# Patient Record
Sex: Male | Born: 1995 | Race: Black or African American | Hispanic: No | Marital: Single | Smoking: Never smoker
Health system: Southern US, Community
[De-identification: ages and names within clinical notes are randomized; demographics above are authoritative.]

---

## 2020-01-10 ENCOUNTER — Emergency Department (HOSPITAL_COMMUNITY)
Admission: EM | Admit: 2020-01-10 | Discharge: 2020-01-10 | Discharge status: Against Medical Advice | Payer: BC Managed Care – PPO | Attending: Emergency Medicine | Admitting: Emergency Medicine

## 2020-01-10 ENCOUNTER — Other Ambulatory Visit: Payer: Self-pay

## 2020-01-10 ENCOUNTER — Encounter (HOSPITAL_COMMUNITY): Payer: Self-pay | Admitting: Obstetrics and Gynecology

## 2020-01-10 DIAGNOSIS — Z113 Encounter for screening for infections with a predominantly sexual mode of transmission: Secondary | ICD-10-CM | POA: Insufficient documentation

## 2020-01-10 DIAGNOSIS — Z202 Contact with and (suspected) exposure to infections with a predominantly sexual mode of transmission: Secondary | ICD-10-CM | POA: Diagnosis present

## 2020-01-10 DIAGNOSIS — J069 Acute upper respiratory infection, unspecified: Secondary | ICD-10-CM | POA: Diagnosis not present

## 2020-01-10 LAB — URINALYSIS, ROUTINE W REFLEX MICROSCOPIC
Bacteria, UA: NONE SEEN
Bilirubin Urine: NEGATIVE
Glucose, UA: NEGATIVE mg/dL
Ketones, ur: NEGATIVE mg/dL
Leukocytes,Ua: NEGATIVE
Nitrite: NEGATIVE
Protein, ur: NEGATIVE mg/dL
Specific Gravity, Urine: 1.006 (ref 1.005–1.030)
pH: 7 (ref 5.0–8.0)

## 2020-01-10 NOTE — ED Triage Notes (Signed)
Patient reports he thinks he was exposed to an STI. Patient reports unprotected sex x2 days ago.

## 2020-01-10 NOTE — ED Notes (Signed)
Patient not in room

## 2020-01-10 NOTE — ED Provider Notes (Signed)
[facility]-EMERGENCY DEPT Provider Note   CSN: [id number] Arrival date & time: 01/10/20  9509     History Chief Complaint  Patient presents with  . Exposure to STD    Kipling Graser is a 24 y.o. male with no pertinent past medical history that presents to the emergency department today for  STD cehck and secondary complaint of headache and sore throat for the past 2 days.  Patient states that 2 days ago he had sexual encounter, unprotected.  States that he is unsure if his male partner has an STD, he does not think that she does but he wants to be safe and be tested for all STDs at this time.  Only admits to vaginal intercourse, does not admit to anal or oral intercourse.  Is not concerned about any STDs in his mouth.  States that he is having some penile itching, denies any penile discharge, scrotal swelling, scrotal pain, penile pain, penile bumps or lumps, penile lesions, rashes, fevers.  Patient states that also 2 days ago he has noticed that he has had a sore throat and headache.  States that he has not been around any sick contacts.  States that his throat is only sore when he swallows, not at all times.  Has not had his Covid vaccines.  Denies any cough, ear pain, eye pain, allergies.  Has not take anything for this.  Does not have migraine history.  States that headache is mild all over his head.  Denies any chest pain, shortness of breath, abdominal pain, nausea, vomiting, anosmia, congestion, diarrhea, diarrhea.  States that he was in normal health before this.  No urinary complaints.  HPI     History reviewed. No pertinent past medical history.  There are no problems to display for this patient.   History reviewed. No pertinent surgical history.     History reviewed. No pertinent family history.  Social History   Tobacco Use  . Smoking status: Never Smoker  Substance Use Topics  . Alcohol use: Not Currently  . Drug use: Not Currently    Home  Medications Prior to Admission medications   Not on File    Allergies    Patient has no allergy information on record.  Review of Systems   Review of Systems  Constitutional: Negative for chills, diaphoresis, fatigue, fever and unexpected weight change.  HENT: Positive for sore throat. Negative for congestion, dental problem, drooling, ear discharge, facial swelling, hearing loss, mouth sores, nosebleeds, sinus pressure, sinus pain, tinnitus, trouble swallowing and voice change.   Eyes: Negative for visual disturbance.  Respiratory: Negative for shortness of breath.   Cardiovascular: Negative for chest pain.  Gastrointestinal: Negative for nausea and vomiting.  Genitourinary: Negative for decreased urine volume, difficulty urinating, discharge, dysuria, enuresis, flank pain, frequency, genital sores, hematuria, penile pain, penile swelling, scrotal swelling, testicular pain and urgency.  Musculoskeletal: Negative for back pain and myalgias.  Skin: Negative for color change, pallor, rash and wound.  Neurological: Positive for headaches. Negative for syncope, weakness, light-headedness and numbness.  Psychiatric/Behavioral: Negative for behavioral problems and confusion.    Physical Exam Updated Vital Signs BP 124/82 (BP Location: Left Arm)   Pulse 67   Temp 98.7 F (37.1 C) (Oral)   Resp 18   SpO2 100%   Physical Exam Constitutional:      General: He is not in acute distress.    Appearance: Normal appearance. He is not ill-appearing, toxic-appearing or diaphoretic.  Comments: Patient without acute respiratory stress.  Patient is sitting comfortably in bed, no tripoding, use of accessory muscles.  Patient is speaking to me in full sentences.  Handling secretions well.  HENT:     Head: Normocephalic and atraumatic.     Jaw: There is normal jaw occlusion. No trismus, swelling or malocclusion.     Nose: No congestion or rhinorrhea.     Right Sinus: No maxillary sinus tenderness  or frontal sinus tenderness.     Left Sinus: No maxillary sinus tenderness or frontal sinus tenderness.     Mouth/Throat:     Mouth: Mucous membranes are moist. No oral lesions.     Dentition: Normal dentition.     Tongue: No lesions.     Palate: No mass and lesions.     Pharynx: Oropharynx is clear. Uvula midline. No pharyngeal swelling, oropharyngeal exudate, posterior oropharyngeal erythema or uvula swelling.     Tonsils: No tonsillar exudate or tonsillar abscesses. 1+ on the right. 1+ on the left.     Comments: Patient without tonsillar enlargement or exudate.  No signs of peritonsillar abscess, palate without any tenderness or masses palpated.  No swelling under the tongue, uvula is midline without any inflammation. Eyes:     General: No visual field deficit.       Right eye: No discharge.        Left eye: No discharge.     Extraocular Movements: Extraocular movements intact.     Conjunctiva/sclera: Conjunctivae normal.     Pupils: Pupils are equal, round, and reactive to light.  Cardiovascular:     Rate and Rhythm: Normal rate and regular rhythm.     Pulses: Normal pulses.     Heart sounds: Normal heart sounds. No murmur heard.  No friction rub. No gallop.   Pulmonary:     Effort: Pulmonary effort is normal. No respiratory distress.     Breath sounds: Normal breath sounds. No stridor. No wheezing, rhonchi or rales.  Chest:     Chest wall: No tenderness.  Abdominal:     General: Abdomen is flat. Bowel sounds are normal. There is no distension.     Palpations: Abdomen is soft.     Tenderness: There is no abdominal tenderness. There is no right CVA tenderness or left CVA tenderness.  Genitourinary:    Comments: Patient rejected. Musculoskeletal:        General: No swelling or tenderness. Normal range of motion.     Cervical back: Normal range of motion. No rigidity or tenderness.     Right lower leg: No edema.     Left lower leg: No edema.  Lymphadenopathy:     Cervical: No  cervical adenopathy.  Skin:    General: Skin is warm and dry.     Capillary Refill: Capillary refill takes less than 2 seconds.     Findings: No erythema or rash.  Neurological:     General: No focal deficit present.     Mental Status: He is alert and oriented to person, place, and time.     Cranial Nerves: Cranial nerves are intact. No cranial nerve deficit or facial asymmetry.     Motor: Motor function is intact. No weakness.     Coordination: Coordination is intact.     Gait: Gait is intact. Gait normal.  Psychiatric:        Mood and Affect: Mood normal.        Behavior: Behavior normal.  Thought Content: Thought content normal.     ED Results / Procedures / Treatments   Labs (all labs ordered are listed, but only abnormal results are displayed) Labs Reviewed  URINALYSIS, ROUTINE W REFLEX MICROSCOPIC - Abnormal; Notable for the following components:      Result Value   Color, Urine STRAW (*)    Hgb urine dipstick SMALL (*)    All other components within normal limits  GROUP A STREP BY PCR  SARS CORONAVIRUS 2 BY RT PCR (HOSPITAL ORDER, PERFORMED IN [facility])  MONONUCLEOSIS SCREEN  RPR  HIV ANTIBODY (ROUTINE TESTING W REFLEX)  GC/CHLAMYDIA PROBE AMP ([place]) NOT AT [facility]    EKG None  Radiology No results found.  Procedures Procedures (including critical care time)  Medications Ordered in ED Medications - No data to display  ED Course  I have reviewed the triage vital signs and the nursing notes.  Pertinent labs & imaging results that were available during my care of the patient were reviewed by me and considered in my medical decision making (see chart for details).    MDM Rules/Calculators/A&P                         Browning Southwood is a 24 y.o. male with no pertinent past medical history that presents to the emergency department today for exposure to STD and secondary complaint of headache and sore throat for the past 2 days.  Patient  was actually exposed to STD that he is aware of, states that he just wants to be checked because he started having some penile itching.  Patient deferred GU exam at this time, is not complaining any scrotal swelling, penile pain, penile lesions, penile masses.  Patient denies any oral intercourse, states that he also has sore throat with headache.  Will get strep testing, mono testing and Covid testing at this time, patient has not had his Covid vaccines.  Physical exam without any concerns for peritonsillar abscess, deep space infection, Ludwig's.  No tonsillar enlargement or tonsillar exudate noticed on exam.  Patient does not want oral STD testing.  Strep testing and mono testing negative.  Urinalysis acutely normal.  Discussed that patient will get results on MyChart.  Nursing advised me that patient eloped.  Patient eloped before testing was able to be done.  Only GC chlamydia urine was collected.  Did not get to discuss risks or treatment options with patient since patient eloped.  Doubt need for further emergent work up at this time. I explained the diagnosis and have given explicit precautions to return to the ER including for any other new or worsening symptoms. The patient understands and accepts the medical plan as it's been dictated and I have answered their questions. Discharge instructions concerning home care and prescriptions have been given. The patient is STABLE and is discharged to home in good condition.  Final Clinical Impression(s) / ED Diagnoses Final diagnoses:  Screen for STD (sexually transmitted disease)  Viral upper respiratory tract infection    Rx / DC Orders ED Discharge Orders    None       Farrel Gordon, PA-C 01/10/20 1556    Milagros Loll, MD 01/12/20 [id number]

## 2020-01-10 NOTE — Discharge Instructions (Signed)
You will get your results on MyChart.  If your Covid test is positive, you will get a call.  If your STD tests are positive then you will also get a call.  Use the attached instructions.  Your COVID test is pending; you should expect results in 2-3 days. You can access your results on your MyChart--if you test positive you should receive a phone call.  In the meantime follow CDC guidelines and quarantine, wear a mask, wash hands often.    Please return to ED if you feel have difficulty breathing or have emergent, new or concerning symptoms.  Patients who have symptoms consistent with COVID-19 should self isolated for: At least 3 days (72 hours) have passed since recovery, defined as resolution of fever without the use of fever reducing medications and improvement in respiratory symptoms (e.g., cough, shortness of breath), and At least 7 days have passed since symptoms first appeared.       Person Under Monitoring Name: Roy Dougherty  Location: [address] [place] [address]   Infection Prevention Recommendations for Individuals Confirmed to have, or Being Evaluated for, 2019 Novel Coronavirus (COVID-19) Infection Who Receive Care at Home  Individuals who are confirmed to have, or are being evaluated for, COVID-19 should follow the prevention steps below until a healthcare provider or local or state health department says they can return to normal activities.  Stay home except to get medical care You should restrict activities outside your home, except for getting medical care. Do not go to work, school, or public areas, and do not use public transportation or taxis.  Call ahead before visiting your doctor Before your medical appointment, call the healthcare provider and tell them that you have, or are being evaluated for, COVID-19 infection. This will help the healthcare provider's office take steps to keep other people from getting infected. Ask your healthcare provider to  call the local or state health department.  Monitor your symptoms Seek prompt medical attention if your illness is worsening (e.g., difficulty breathing). Before going to your medical appointment, call the healthcare provider and tell them that you have, or are being evaluated for, COVID-19 infection. Ask your healthcare provider to call the local or state health department.  Wear a facemask You should wear a facemask that covers your nose and mouth when you are in the same room with other people and when you visit a healthcare provider. People who live with or visit you should also wear a facemask while they are in the same room with you.  Separate yourself from other people in your home As much as possible, you should stay in a different room from other people in your home. Also, you should use a separate bathroom, if available.  Avoid sharing household items You should not share dishes, drinking glasses, cups, eating utensils, towels, bedding, or other items with other people in your home. After using these items, you should wash them thoroughly with soap and water.  Cover your coughs and sneezes Cover your mouth and nose with a tissue when you cough or sneeze, or you can cough or sneeze into your sleeve. Throw used tissues in a lined trash can, and immediately wash your hands with soap and water for at least 20 seconds or use an alcohol-based hand rub.  Wash your [employer] your hands often and thoroughly with soap and water for at least 20 seconds. You can use an alcohol-based hand sanitizer if soap and water are not available and  if your hands are not visibly dirty. Avoid touching your eyes, nose, and mouth with unwashed hands.   Prevention Steps for Caregivers and Household Members of Individuals Confirmed to have, or Being Evaluated for, COVID-19 Infection Being Cared for in the Home  If you live with, or provide care at home for, a person confirmed to have, or being  evaluated for, COVID-19 infection please follow these guidelines to prevent infection:  Follow healthcare provider's instructions Make sure that you understand and can help the patient follow any healthcare provider instructions for all care.  Provide for the patient's basic needs You should help the patient with basic needs in the home and provide support for getting groceries, prescriptions, and other personal needs.  Monitor the patient's symptoms If they are getting sicker, call his or her medical provider and tell them that the patient has, or is being evaluated for, COVID-19 infection. This will help the healthcare provider's office take steps to keep other people from getting infected. Ask the healthcare provider to call the local or state health department.  Limit the number of people who have contact with the patient If possible, have only one caregiver for the patient. Other household members should stay in another home or place of residence. If this is not possible, they should stay in another room, or be separated from the patient as much as possible. Use a separate bathroom, if available. Restrict visitors who do not have an essential need to be in the home.  Keep older adults, very young children, and other sick people away from the patient Keep older adults, very young children, and those who have compromised immune systems or chronic health conditions away from the patient. This includes people with chronic heart, lung, or kidney conditions, diabetes, and cancer.  Ensure good ventilation Make sure that shared spaces in the home have good air flow, such as from an air conditioner or an opened window, weather permitting.  Wash your hands often Wash your hands often and thoroughly with soap and water for at least 20 seconds. You can use an alcohol based hand sanitizer if soap and water are not available and if your hands are not visibly dirty. Avoid touching your eyes,  nose, and mouth with unwashed hands. Use disposable paper towels to dry your hands. If not available, use dedicated cloth towels and replace them when they become wet.  Wear a facemask and gloves Wear a disposable facemask at all times in the room and gloves when you touch or have contact with the patient's blood, body fluids, and/or secretions or excretions, such as sweat, saliva, sputum, nasal mucus, vomit, urine, or feces.  Ensure the mask fits over your nose and mouth tightly, and do not touch it during use. Throw out disposable facemasks and gloves after using them. Do not reuse. Wash your hands immediately after removing your facemask and gloves. If your personal clothing becomes contaminated, carefully remove clothing and launder. Wash your hands after handling contaminated clothing. Place all used disposable facemasks, gloves, and other waste in a lined container before disposing them with other household waste. Remove gloves and wash your hands immediately after handling these items.  Do not share dishes, glasses, or other household items with the patient Avoid sharing household items. You should not share dishes, drinking glasses, cups, eating utensils, towels, bedding, or other items with a patient who is confirmed to have, or being evaluated for, COVID-19 infection. After the person uses these items, you should wash them  thoroughly with soap and water.  Wash laundry thoroughly Immediately remove and wash clothes or bedding that have blood, body fluids, and/or secretions or excretions, such as sweat, saliva, sputum, nasal mucus, vomit, urine, or feces, on them. Wear gloves when handling laundry from the patient. Read and follow directions on labels of laundry or clothing items and detergent. In general, wash and dry with the warmest temperatures recommended on the label.  Clean all areas the individual has used often Clean all touchable surfaces, such as counters, tabletops,  doorknobs, bathroom fixtures, toilets, phones, keyboards, tablets, and bedside tables, every day. Also, clean any surfaces that may have blood, body fluids, and/or secretions or excretions on them. Wear gloves when cleaning surfaces the patient has come in contact with. Use a diluted bleach solution (e.g., dilute bleach with 1 part bleach and 10 parts water) or a household disinfectant with a label that says EPA-registered for coronaviruses. To make a bleach solution at home, add 1 tablespoon of bleach to 1 quart (4 cups) of water. For a larger supply, add  cup of bleach to 1 gallon (16 cups) of water. Read labels of cleaning products and follow recommendations provided on product labels. Labels contain instructions for safe and effective use of the cleaning product including precautions you should take when applying the product, such as wearing gloves or eye protection and making sure you have good ventilation during use of the product. Remove gloves and wash hands immediately after cleaning.  Monitor yourself for signs and symptoms of illness Caregivers and household members are considered close contacts, should monitor their health, and will be asked to limit movement outside of the home to the extent possible. Follow the monitoring steps for close contacts listed on the symptom monitoring form.   ? If you have additional questions, contact your local health department or call the epidemiologist on call at [phone] (available 24/7). ? This guidance is subject to change. For the most up-to-date guidance from [facility], please refer to their website: [email or web address]

## 2020-01-17 ENCOUNTER — Emergency Department (HOSPITAL_COMMUNITY): Payer: Worker's Compensation

## 2020-01-17 ENCOUNTER — Other Ambulatory Visit: Payer: Self-pay

## 2020-01-17 ENCOUNTER — Encounter (HOSPITAL_COMMUNITY): Payer: Self-pay

## 2020-01-17 DIAGNOSIS — M79641 Pain in right hand: Secondary | ICD-10-CM | POA: Diagnosis present

## 2020-01-17 DIAGNOSIS — M654 Radial styloid tenosynovitis [de Quervain]: Secondary | ICD-10-CM | POA: Insufficient documentation

## 2020-01-17 NOTE — ED Triage Notes (Signed)
Pt sts right wrist pain after months of repetitive movements at work. Denies injury. Full rom.

## 2020-01-18 ENCOUNTER — Emergency Department (HOSPITAL_COMMUNITY)
Admission: EM | Admit: 2020-01-18 | Discharge: 2020-01-18 | Disposition: A | Discharge status: Home / Self Care | Payer: Worker's Compensation | Attending: Emergency Medicine | Admitting: Emergency Medicine

## 2020-01-18 DIAGNOSIS — M654 Radial styloid tenosynovitis [de Quervain]: Secondary | ICD-10-CM

## 2020-01-18 NOTE — Discharge Instructions (Signed)
Take 4 over the counter ibuprofen tablets 3 times a day or 2 over-the-counter naproxen tablets twice a day for pain. Also take tylenol 1000mg(2 extra strength) four times a day.    

## 2020-01-18 NOTE — ED Provider Notes (Signed)
[facility]-EMERGENCY DEPT Provider Note   CSN: [id number] Arrival date & time: 01/17/20  2308     History Chief Complaint  Patient presents with  . Wrist Pain    Roy Dougherty is a 24 y.o. male.  24 yo M with a chief complaints of right hand pain.  This been going on for about a month or so.  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  He has got to the point where he has had to stop working because of the pain.  Hurts from his wrist all the way up to the elbow.  Denies trauma denies swelling.  The history is provided by the patient.  Wrist Pain This is a new problem. The current episode started more than 1 week ago. The problem occurs constantly. The problem has been gradually worsening. Pertinent negatives include no chest pain, no abdominal pain, no headaches and no shortness of breath. The symptoms are aggravated by bending and twisting. Nothing relieves the symptoms. He has tried nothing for the symptoms. The treatment provided no relief.       History reviewed. No pertinent past medical history.  There are no problems to display for this patient.   History reviewed. No pertinent surgical history.     No family history on file.  Social History   Tobacco Use  . Smoking status: Never Smoker  . Smokeless tobacco: Never Used  Substance Use Topics  . Alcohol use: Not Currently  . Drug use: Not Currently    Home Medications Prior to Admission medications   Not on File    Allergies    Patient has no known allergies.  Review of Systems   Review of Systems  Constitutional: Negative for chills and fever.  HENT: Negative for congestion and facial swelling.   Eyes: Negative for discharge and visual disturbance.  Respiratory: Negative for shortness of breath.   Cardiovascular: Negative for chest pain and palpitations.  Gastrointestinal: Negative for  abdominal pain, diarrhea and vomiting.  Musculoskeletal: Positive for arthralgias and myalgias.  Skin: Negative for color change and rash.  Neurological: Negative for tremors, syncope and headaches.  Psychiatric/Behavioral: Negative for confusion and dysphoric mood.    Physical Exam Updated Vital Signs BP 125/71 (BP Location: Left Arm)   Pulse 83   Temp 98.2 F (36.8 C) (Oral)   Resp 18   Ht 5\' 9"  (1.753 m)   Wt 74.8 kg   SpO2 100%   BMI 24.37 kg/m   Physical Exam Vitals and nursing note reviewed.  Constitutional:      Appearance: He is well-developed.  HENT:     Head: Normocephalic and atraumatic.  Eyes:     Pupils: Pupils are equal, round, and reactive to light.  Neck:     Vascular: No JVD.  Cardiovascular:     Rate and Rhythm: Normal rate and regular rhythm.     Heart sounds: No murmur heard.  No friction rub. No gallop.   Pulmonary:     Effort: No respiratory distress.     Breath sounds: No wheezing.  Abdominal:     General: There is no distension.     Tenderness: There is no guarding or rebound.  Musculoskeletal:        General: Tenderness present. Normal range of motion.     Cervical back: Normal range of motion and neck supple.  Comments: Pulse motor and sensation are intact to the right upper extremity.  Patient has some pain along the thenar eminence of the right hand.  He also has some tenderness with Lourena Simmonds test.  No pain with percussion over the median canal.  Full range of motion of the wrist.  Skin:    Coloration: Skin is not pale.     Findings: No rash.  Neurological:     Mental Status: He is alert and oriented to person, place, and time.  Psychiatric:        Behavior: Behavior normal.     ED Results / Procedures / Treatments   Labs (all labs ordered are listed, but only abnormal results are displayed) Labs Reviewed - No data to display  EKG None  Radiology DG Wrist Complete Right  Result Date: 01/18/2020 CLINICAL DATA:  Wrist  pain EXAM: RIGHT WRIST - COMPLETE 3+ VIEW COMPARISON:  None. FINDINGS: There is no evidence of fracture or dislocation. There is no evidence of arthropathy or other focal bone abnormality. Soft tissues are unremarkable. IMPRESSION: Negative. Electronically Signed   By: Jasmine Pang M.D.   On: 01/18/2020 00:10    Procedures Procedures (including critical care time)  Medications Ordered in ED Medications - No data to display  ED Course  I have reviewed the triage vital signs and the nursing notes.  Pertinent labs & imaging results that were available during my care of the patient were reviewed by me and considered in my medical decision making (see chart for details).    MDM Rules/Calculators/A&P                          24 yo M with a chief complaints of right wrist pain.  This been going on for about a month now.  Clinically the patient has tenosynovitis.  Will immobilize have him follow-up with sports medicine and PCP.  2:14 AM:  I have discussed the diagnosis/risks/treatment options with the patient and believe the pt to be eligible for discharge home to follow-up with PCP. We also discussed returning to the ED immediately if new or worsening sx occur. We discussed the sx which are most concerning (e.g., sudden worsening pain, fever, inability to tolerate by mouth) that necessitate immediate return. Medications administered to the patient during their visit and any new prescriptions provided to the patient are listed below.  Medications given during this visit Medications - No data to display   The patient appears reasonably screen and/or stabilized for discharge and I doubt any other medical condition or other [facility] requiring further screening, evaluation, or treatment in the ED at this time prior to discharge.   Final Clinical Impression(s) / ED Diagnoses Final diagnoses:  [name] disease (tenosynovitis)    Rx / DC Orders ED Discharge Orders    None       Melene Plan,  DO 01/18/20 0214

## 2021-04-28 ENCOUNTER — Encounter (HOSPITAL_COMMUNITY): Payer: Self-pay | Admitting: Emergency Medicine

## 2021-04-28 ENCOUNTER — Emergency Department (HOSPITAL_COMMUNITY)
Admission: EM | Admit: 2021-04-28 | Discharge: 2021-04-28 | Disposition: A | Discharge status: Home / Self Care | Payer: BC Managed Care – PPO | Attending: Emergency Medicine | Admitting: Emergency Medicine

## 2021-04-28 DIAGNOSIS — J029 Acute pharyngitis, unspecified: Secondary | ICD-10-CM | POA: Insufficient documentation

## 2021-04-28 DIAGNOSIS — Z20822 Contact with and (suspected) exposure to covid-19: Secondary | ICD-10-CM | POA: Insufficient documentation

## 2021-04-28 DIAGNOSIS — J019 Acute sinusitis, unspecified: Secondary | ICD-10-CM | POA: Insufficient documentation

## 2021-04-28 DIAGNOSIS — R070 Pain in throat: Secondary | ICD-10-CM | POA: Diagnosis present

## 2021-04-28 LAB — RESP PANEL BY RT-PCR (FLU A&B, COVID) ARPGX2
Influenza A by PCR: NEGATIVE
Influenza B by PCR: NEGATIVE
SARS Coronavirus 2 by RT PCR: NEGATIVE

## 2021-04-28 LAB — GROUP A STREP BY PCR: Group A Strep by PCR: NOT DETECTED

## 2021-04-28 MED ORDER — AMOXICILLIN-POT CLAVULANATE 875-125 MG PO TABS
1.0000 | ORAL_TABLET | Freq: Two times a day (BID) | ORAL | 0 refills | Status: DC
Start: 2021-04-28 — End: 2023-09-22

## 2021-04-28 MED ORDER — DEXAMETHASONE 4 MG PO TABS
4.0000 mg | ORAL_TABLET | Freq: Once | ORAL | Status: AC
  Administered 2021-04-28: 4 mg via ORAL
  Filled 2021-04-28: qty 1

## 2021-04-28 NOTE — ED Provider Notes (Signed)
[facility]-EMERGENCY DEPT Provider Note   CSN: [id number] Arrival date & time: 04/28/21  0907     History Chief Complaint  Patient presents with   Sore Throat    Roy Dougherty is a 24 y.o. male otherwise healthy presents to the emergency department for evaluation of sore throat for the past 10 days.  Patient was complains of nasal congestion, rhinorrhea, body aches. Patient reports a mild cough that has improved during this time.  Denies any reported fevers or chills, abdominal pain, nausea, vomiting, headache, ear pain.  Patient reports the pain is exacerbated by eating and drinking, but is still able to eat and drink.  Denies any known sick contacts.  Denies any medical or surgical history.  Denies any daily medications.  No known drug allergies.  Denies any EtOH.  Reports tobacco use and marijuana use.   Sore Throat Pertinent negatives include no chest pain, no abdominal pain and no shortness of breath.      History reviewed. No pertinent past medical history.  There are no problems to display for this patient.   History reviewed. No pertinent surgical history.     No family history on file.  Social History   Tobacco Use   Smoking status: Never   Smokeless tobacco: Never  Substance Use Topics   Alcohol use: Not Currently   Drug use: Not Currently    Home Medications Prior to Admission medications   Medication Sig Start Date End Date Taking? Authorizing Provider  amoxicillin-clavulanate (AUGMENTIN) 875-125 MG tablet Take 1 tablet by mouth every 12 (twelve) hours. 04/28/21  Yes Achille Rich, PA-C    Allergies    Patient has no known allergies.  Review of Systems   Review of Systems  Constitutional:  Negative for chills and fever.  HENT:  Positive for congestion, rhinorrhea and sore throat. Negative for ear pain.   Eyes:  Negative for pain and visual disturbance.  Respiratory:  Positive for cough. Negative for shortness of breath.    Cardiovascular:  Negative for chest pain and palpitations.  Gastrointestinal:  Negative for abdominal pain and vomiting.  Genitourinary:  Negative for dysuria and hematuria.  Musculoskeletal:  Negative for arthralgias and back pain.  Skin:  Negative for color change and rash.  Neurological:  Negative for seizures and syncope.  All other systems reviewed and are negative.  Physical Exam Updated Vital Signs BP 127/71 (BP Location: Left Arm)   Pulse 73   Temp 99 F (37.2 C) (Oral)   Resp 18   SpO2 100%   Physical Exam Vitals and nursing note reviewed.  Constitutional:      General: He is not in acute distress.    Appearance: Normal appearance. He is not toxic-appearing.     Comments: Speech normal.  Patient managing secretions.  HENT:     Head: Normocephalic and atraumatic.     Right Ear: Tympanic membrane and ear canal normal. No tenderness. Tympanic membrane is not erythematous.     Left Ear: Tympanic membrane and ear canal normal. No tenderness. Tympanic membrane is not erythematous.     Nose: Congestion present.     Comments: Edema and erythema to bilateral nares with scant clear nasal discharge    Mouth/Throat:     Mouth: Mucous membranes are moist. No oral lesions.     Pharynx: Oropharynx is clear. Uvula midline. Posterior oropharyngeal erythema present. No pharyngeal swelling, oropharyngeal exudate or uvula swelling.     Comments: Moist mucous membranes.  Mild  pharyngeal erythema to posterior oropharynx.  No edema or exudate is noted.  Uvula midline.  Airway patent. Eyes:     General: No scleral icterus. Cardiovascular:     Rate and Rhythm: Normal rate and regular rhythm.  Pulmonary:     Effort: Pulmonary effort is normal.     Breath sounds: Normal breath sounds.  Abdominal:     General: Abdomen is flat. Bowel sounds are normal.     Palpations: Abdomen is soft.  Musculoskeletal:        General: No deformity.     Cervical back: Normal range of motion.   Lymphadenopathy:     Cervical: No cervical adenopathy.  Skin:    General: Skin is warm and dry.  Neurological:     General: No focal deficit present.     Mental Status: He is alert. Mental status is at baseline.    ED Results / Procedures / Treatments   Labs (all labs ordered are listed, but only abnormal results are displayed) Labs Reviewed  GROUP A STREP BY PCR  RESP PANEL BY RT-PCR (FLU A&B, COVID) ARPGX2    EKG None  Radiology No results found.  Procedures Procedures   Medications Ordered in ED Medications  dexamethasone (DECADRON) tablet 4 mg (4 mg Oral Given 04/28/21 1032)    ED Course  I have reviewed the triage vital signs and the nursing notes.  Pertinent labs & imaging results that were available during my care of the patient were reviewed by me and considered in my medical decision making (see chart for details).  25 year old male presents emergency department flulike symptoms the past 10 days.  COVID, and strep negative.  Patient presents with mild pharyngeal erythema, without edema or exudate.  Overall very reassuring exam.  Patient speaking in full sentences with ease.  No concern for PTA as patient's tonsils are not edematous, erythematous, or or have exudate or lesions present.  Uvula midline.  With patient's duration of symptoms without relief, will prescribe patient Augmentin for sinusitis.  Additionally, gave patient Decadron tablet while in the emergency department.  Return precautions discussed with patient.  Patient agrees with plan.  Patient is stable and being discharged home in good condition.    MDM Rules/Calculators/A&P                          Final Clinical Impression(s) / ED Diagnoses Final diagnoses:  Pharyngitis, unspecified etiology  Acute non-recurrent sinusitis, unspecified location    Rx / DC Orders ED Discharge Orders          Ordered    amoxicillin-clavulanate (AUGMENTIN) 875-125 MG tablet  Every 12 hours        04/28/21  1023             Sherrell Puller, [place] 04/28/21 Scott, MD 04/29/21 [id number]

## 2021-04-28 NOTE — ED Triage Notes (Signed)
PT c/o sore throat since last week. Reports congestion x2 weeks. Denies cough and fever.

## 2021-04-28 NOTE — Discharge Instructions (Addendum)
You were seen here today for eval of sore throat and nasal ingestion, rhinorrhea for the past 10 days.  Because of the duration of your symptoms, you have been prescribed a antibiotic called Augmentin that she will take twice a day for the next 7 days.  Please try salt water rinses.  Additional information on sore throat includes in his discharge paperwork.  If any concern, new or worsening symptoms, please return to the nearest emergency department for evaluation.

## 2021-05-31 IMAGING — CR DG WRIST COMPLETE 3+V*R*
4 series · 4 of 4 positions shown · non-contrast
Comparison: None.

CLINICAL DATA: Wrist pain

EXAM:
RIGHT WRIST - COMPLETE 3+ VIEW

[x wrist pa right]
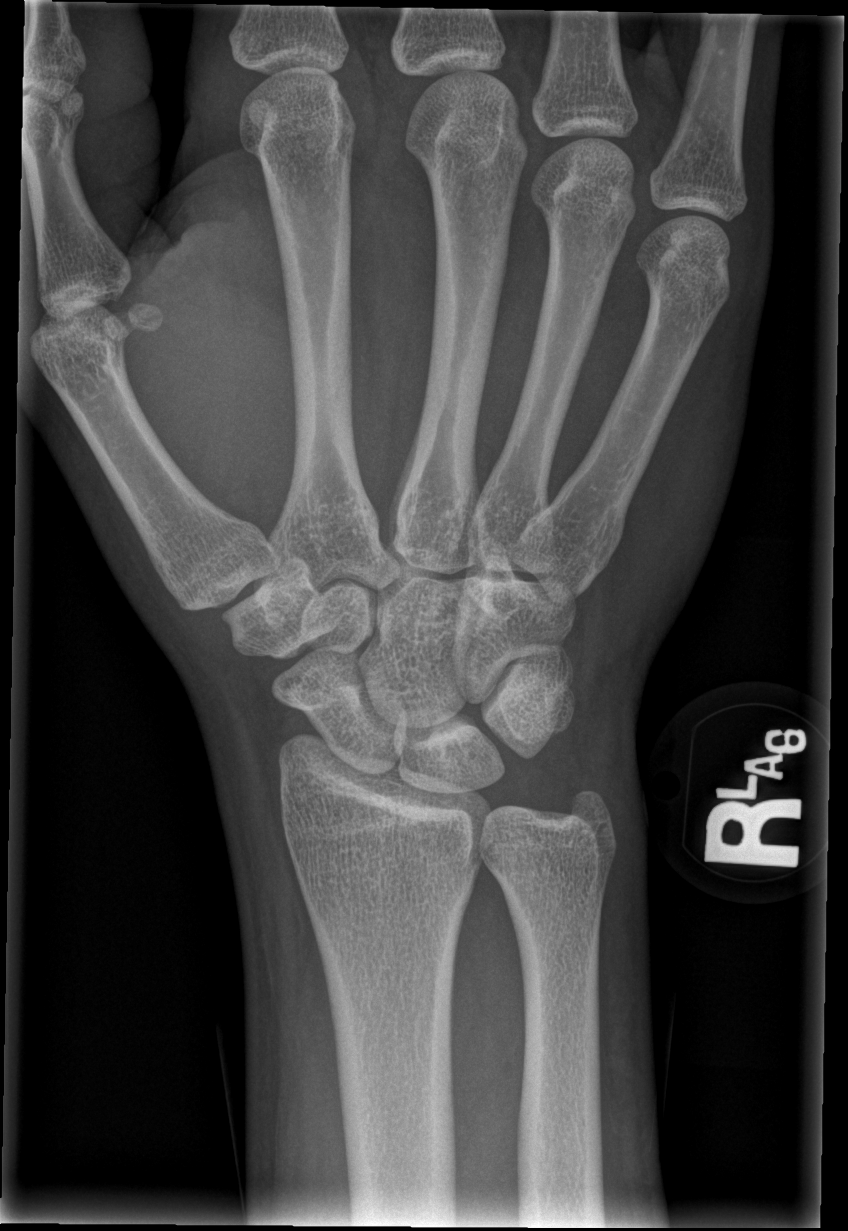

[x wrist obl right]
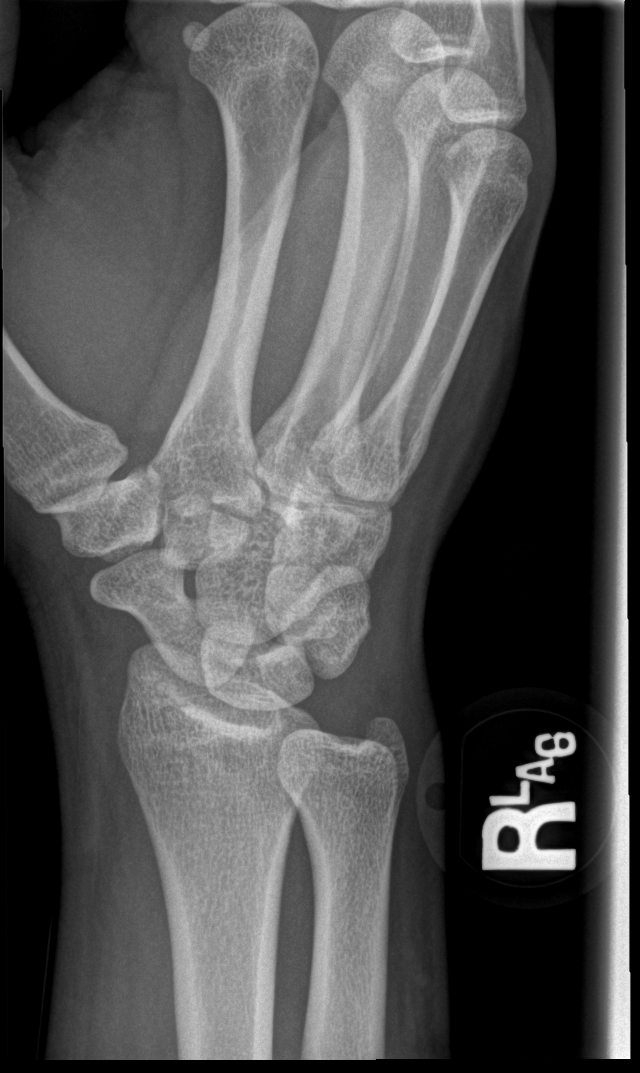

[x wrist lat right]
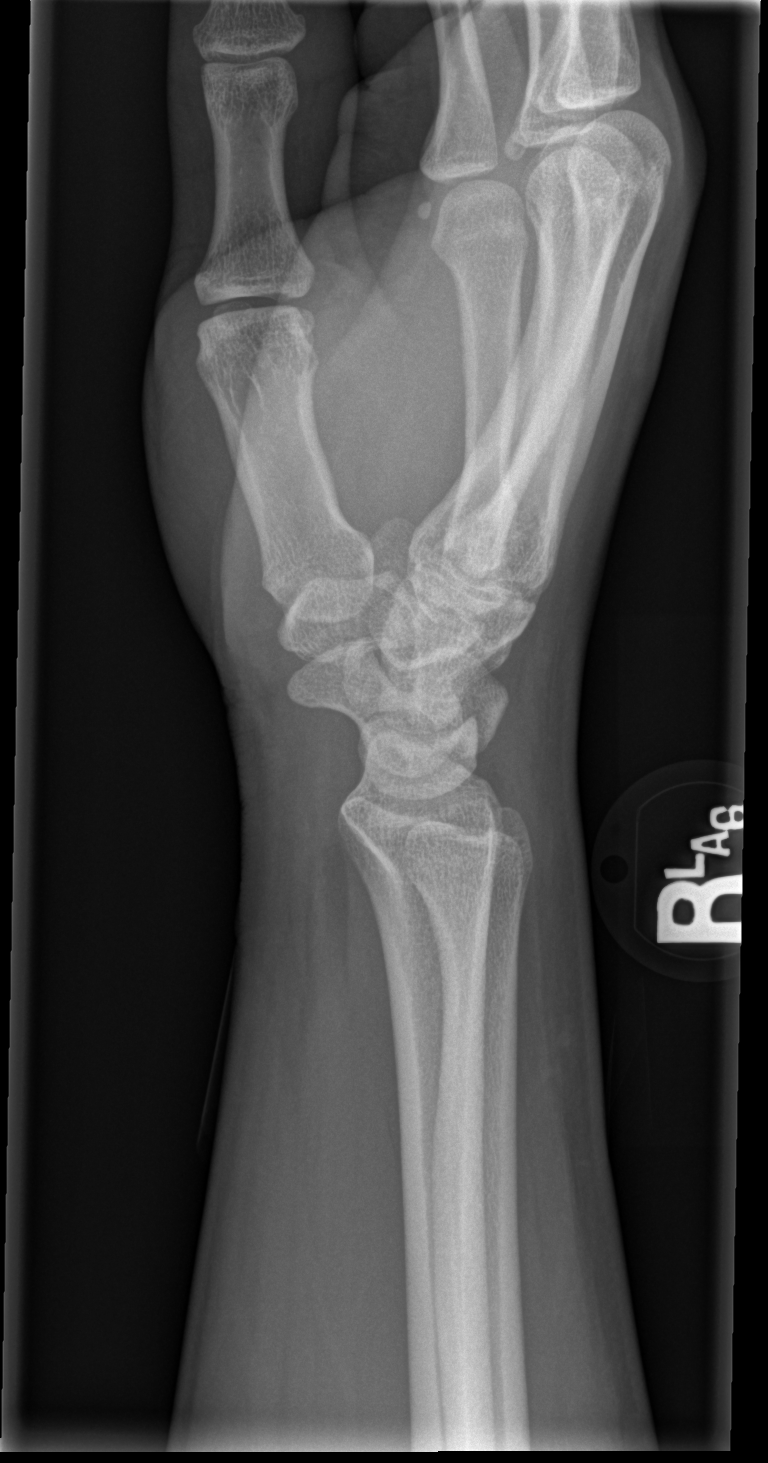

[x wrist navicular view right]
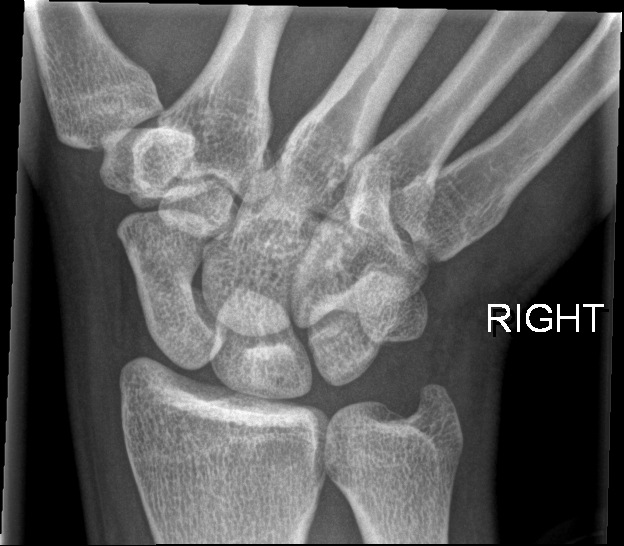

[4 of 4 positions shown; findings below may reference images not displayed]

FINDINGS: There is no evidence of fracture or dislocation. There is no
evidence of arthropathy or other focal bone abnormality. Soft
tissues are unremarkable.
IMPRESSION: Negative.

## 2023-09-22 ENCOUNTER — Ambulatory Visit
Admission: EM | Admit: 2023-09-22 | Discharge: 2023-09-22 | Disposition: A | Discharge status: Home / Self Care | Payer: Self-pay | Attending: Physician Assistant | Admitting: Physician Assistant

## 2023-09-22 DIAGNOSIS — R509 Fever, unspecified: Secondary | ICD-10-CM

## 2023-09-22 DIAGNOSIS — R52 Pain, unspecified: Secondary | ICD-10-CM

## 2023-09-22 DIAGNOSIS — R519 Headache, unspecified: Secondary | ICD-10-CM

## 2023-09-22 LAB — RESP PANEL BY RT-PCR (FLU A&B, COVID) ARPGX2
Influenza A by PCR: NEGATIVE
Influenza B by PCR: NEGATIVE
SARS Coronavirus 2 by RT PCR: NEGATIVE

## 2023-09-22 MED ORDER — IBUPROFEN 800 MG PO TABS
800.0000 mg | ORAL_TABLET | Freq: Once | ORAL | Status: AC
  Administered 2023-09-22: 800 mg via ORAL

## 2023-09-22 NOTE — ED Provider Notes (Signed)
 [facility]    CSN: [id number] Arrival date & time: 09/22/23  1444      History   Chief Complaint Chief Complaint  Patient presents with   Headache   Chills   Generalized Body Aches    HPI Roy Dougherty is a 28 y.o. male presenting for fever, fatigue, body aches and headaches x 2 days. Denies cough, congestion, sore throat, chest pain, shortness of breath, abdominal pain, vomiting or diarrhea.  Denies dysuria, frequency, urgency, hematuria.  No sick contacts.  Has taken Tylenol.  Current headache is 7 out of 10.  HPI  History reviewed. No pertinent past medical history.  There are no active problems to display for this patient.   History reviewed. No pertinent surgical history.     Home Medications    Prior to Admission medications   Not on File    Family History History reviewed. No pertinent family history.  Social History Social History   Tobacco Use   Smoking status: Never   Smokeless tobacco: Never  Substance Use Topics   Alcohol use: Not Currently   Drug use: Not Currently     Allergies   Patient has no known allergies.   Review of Systems Review of Systems  Constitutional:  Positive for fatigue and fever.  HENT:  Negative for congestion, rhinorrhea, sinus pressure, sinus pain and sore throat.   Respiratory:  Negative for cough and shortness of breath.   Cardiovascular:  Negative for chest pain.  Gastrointestinal:  Negative for abdominal pain, diarrhea, nausea and vomiting.  Musculoskeletal:  Positive for myalgias.  Neurological:  Positive for headaches. Negative for weakness and light-headedness.  Hematological:  Negative for adenopathy.     Physical Exam Triage Vital Signs ED Triage Vitals  Encounter Vitals Group     BP      Systolic BP Percentile      Diastolic BP Percentile      Pulse      Resp      Temp      Temp src      SpO2      Weight      Height      Head Circumference      Peak Flow      Pain Score       Pain Loc      Pain Education      Exclude from Growth Chart    No data found.  Updated Vital Signs BP 122/85 (BP Location: Right Arm)   Pulse 75   Temp (!) 100.5 F (38.1 C) (Oral)   Resp 16   Wt 170 lb (77.1 kg)   SpO2 97%   BMI 25.10 kg/m    Physical Exam Vitals and nursing note reviewed.  Constitutional:      General: He is not in acute distress.    Appearance: Normal appearance. He is well-developed. He is not ill-appearing.  HENT:     Head: Normocephalic and atraumatic.     Nose: Congestion present.     Mouth/Throat:     Mouth: Mucous membranes are moist.     Pharynx: Oropharynx is clear.  Eyes:     General: No scleral icterus.    Conjunctiva/sclera: Conjunctivae normal.  Neck:     Comments: Negative Kernig and Brudzinski signs Cardiovascular:     Rate and Rhythm: Normal rate and regular rhythm.     Heart sounds: Normal heart sounds.  Pulmonary:     Effort: Pulmonary effort is normal.  No respiratory distress.     Breath sounds: Normal breath sounds.  Abdominal:     Palpations: Abdomen is soft.     Tenderness: There is no abdominal tenderness.  Musculoskeletal:     Cervical back: Normal range of motion and neck supple. No rigidity or tenderness.  Skin:    General: Skin is warm and dry.     Capillary Refill: Capillary refill takes less than 2 seconds.  Neurological:     General: No focal deficit present.     Mental Status: He is alert. Mental status is at baseline.     Motor: No weakness.     Gait: Gait normal.  Psychiatric:        Mood and Affect: Mood normal.        Behavior: Behavior normal.      UC Treatments / Results  Labs (all labs ordered are listed, but only abnormal results are displayed) Labs Reviewed  RESP PANEL BY RT-PCR (FLU A&B, COVID) ARPGX2    EKG   Radiology No results found.  Procedures Procedures (including critical care time)  Medications Ordered in UC Medications  ibuprofen (ADVIL) tablet 800 mg (800 mg Oral Given  09/22/23 1623)    Initial Impression / Assessment and Plan / UC Course  I have reviewed the triage vital signs and the nursing notes.  Pertinent labs & imaging results that were available during Roy care of the patient were reviewed by me and considered in Roy medical decision making (see chart for details).   28 year old male presents for fever, fatigue, headache and bodyaches x 2 days.  Denies URI symptoms and no diarrhea, constipation, abdominal pain, nausea/vomiting, urinary symptoms.  No sick contacts.  Current temp is 100.5 degrees.  Overall well-appearing.  No acute distress.  Slight nasal congestion.  Throat is clear.  Chest clear.  Heart regular rate and rhythm.  Abdomen soft and nontender.  Respiratory panel obtained.  All negative.  Reviewed results with patient.  Offered ketorolac for headache but he declines.  Patient given 800 milligrams ibuprofen.  Discussed with patient that his symptoms are vague and do not point to any specific cause.  Explained that his symptoms are likely viral and I would advise supportive care with continuing ibuprofen and Tylenol, rest and fluids.  Advised to monitor symptoms closely and if symptoms worsen or he develops any specific symptoms he should go to ER or return for reevaluation.  Work note was given.   Final Clinical Impressions(s) / UC Diagnoses   Final diagnoses:  Acute nonintractable headache, unspecified headache type  Body aches  Low grade fever     Discharge Instructions      -Negative COVID and flu testing.  Your symptoms are not really pointing to anything besides a viral illness. - I would advise continuing ibuprofen and Tylenol for your headaches, rest and increasing her fluid intake. - Monitor your symptoms closely and if you begin to have worsening headache, worsening neck pain, productive cough, chest pain, breathing difficulty, abdominal pain, weakness, urinary symptoms, etc. please seek reevaluation.     ED  Prescriptions   None    PDMP not reviewed this encounter.   Shirlee Latch, PA-C 09/22/23 1704

## 2023-09-22 NOTE — ED Triage Notes (Signed)
 Pt c/o HA,chills & bodyaches x3 days.

## 2023-09-22 NOTE — Discharge Instructions (Signed)
-  Negative COVID and flu testing.  Your symptoms are not really pointing to anything besides a viral illness. - I would advise continuing ibuprofen and Tylenol for your headaches, rest and increasing her fluid intake. - Monitor your symptoms closely and if you begin to have worsening headache, worsening neck pain, productive cough, chest pain, breathing difficulty, abdominal pain, weakness, urinary symptoms, etc. please seek reevaluation.
